# Patient Record
Sex: Female | Born: 1950 | Race: Black or African American | Hispanic: No | Marital: Married | Smoking: Never smoker
Health system: Southern US, Community
[De-identification: ages and names within clinical notes are randomized; demographics above are authoritative.]

## PROBLEM LIST (undated history)

## (undated) DIAGNOSIS — N6022 Fibroadenosis of left breast: Secondary | ICD-10-CM

## (undated) DIAGNOSIS — H409 Unspecified glaucoma: Secondary | ICD-10-CM

## (undated) DIAGNOSIS — M1990 Unspecified osteoarthritis, unspecified site: Secondary | ICD-10-CM

## (undated) DIAGNOSIS — M9250 Juvenile osteochondrosis of tibia and fibula, unspecified leg: Secondary | ICD-10-CM

## (undated) DIAGNOSIS — H5010 Unspecified exotropia: Secondary | ICD-10-CM

## (undated) DIAGNOSIS — Z1211 Encounter for screening for malignant neoplasm of colon: Secondary | ICD-10-CM

## (undated) DIAGNOSIS — Z86010 Personal history of colonic polyps: Secondary | ICD-10-CM

## (undated) DIAGNOSIS — K219 Gastro-esophageal reflux disease without esophagitis: Secondary | ICD-10-CM

## (undated) DIAGNOSIS — E119 Type 2 diabetes mellitus without complications: Secondary | ICD-10-CM

## (undated) DIAGNOSIS — I10 Essential (primary) hypertension: Secondary | ICD-10-CM

## (undated) DIAGNOSIS — M92529 Juvenile osteochondrosis of tibia tubercle, unspecified leg: Secondary | ICD-10-CM

## (undated) DIAGNOSIS — H50111 Monocular exotropia, right eye: Secondary | ICD-10-CM

## (undated) HISTORY — PX: ESOPHAGOGASTRODUODENOSCOPY: SHX1529

## (undated) HISTORY — PX: TUBAL LIGATION: SHX77

---

## 2003-11-14 ENCOUNTER — Ambulatory Visit: Payer: Self-pay | Admitting: Family Medicine

## 2005-03-07 ENCOUNTER — Ambulatory Visit: Payer: Self-pay | Admitting: Family Medicine

## 2007-03-26 ENCOUNTER — Ambulatory Visit: Payer: Self-pay | Admitting: Gastroenterology

## 2016-08-06 ENCOUNTER — Encounter: Payer: Self-pay | Admitting: *Deleted

## 2016-08-07 ENCOUNTER — Encounter
Admission: RE | Disposition: A | Discharge status: Home / Self Care | Payer: Self-pay | Source: Ambulatory Visit | Attending: Unknown Physician Specialty

## 2016-08-07 ENCOUNTER — Ambulatory Visit
Admission: RE | Admit: 2016-08-07 | Discharge: 2016-08-07 | Disposition: A | Discharge status: Home / Self Care | Payer: Medicare Other | Source: Ambulatory Visit | Attending: Unknown Physician Specialty | Admitting: Unknown Physician Specialty

## 2016-08-07 ENCOUNTER — Ambulatory Visit: Payer: Medicare Other | Admitting: Anesthesiology

## 2016-08-07 ENCOUNTER — Encounter: Payer: Self-pay | Admitting: *Deleted

## 2016-08-07 DIAGNOSIS — Z79899 Other long term (current) drug therapy: Secondary | ICD-10-CM | POA: Insufficient documentation

## 2016-08-07 DIAGNOSIS — Z7982 Long term (current) use of aspirin: Secondary | ICD-10-CM | POA: Insufficient documentation

## 2016-08-07 DIAGNOSIS — D123 Benign neoplasm of transverse colon: Secondary | ICD-10-CM | POA: Insufficient documentation

## 2016-08-07 DIAGNOSIS — K219 Gastro-esophageal reflux disease without esophagitis: Secondary | ICD-10-CM | POA: Diagnosis not present

## 2016-08-07 DIAGNOSIS — Z86010 Personal history of colonic polyps: Secondary | ICD-10-CM | POA: Diagnosis not present

## 2016-08-07 DIAGNOSIS — M1990 Unspecified osteoarthritis, unspecified site: Secondary | ICD-10-CM | POA: Diagnosis not present

## 2016-08-07 DIAGNOSIS — E119 Type 2 diabetes mellitus without complications: Secondary | ICD-10-CM | POA: Insufficient documentation

## 2016-08-07 DIAGNOSIS — H409 Unspecified glaucoma: Secondary | ICD-10-CM | POA: Diagnosis not present

## 2016-08-07 DIAGNOSIS — M9250 Juvenile osteochondrosis of tibia and fibula, unspecified leg: Secondary | ICD-10-CM | POA: Diagnosis not present

## 2016-08-07 DIAGNOSIS — I10 Essential (primary) hypertension: Secondary | ICD-10-CM | POA: Insufficient documentation

## 2016-08-07 DIAGNOSIS — Z1211 Encounter for screening for malignant neoplasm of colon: Secondary | ICD-10-CM | POA: Diagnosis not present

## 2016-08-07 DIAGNOSIS — Z794 Long term (current) use of insulin: Secondary | ICD-10-CM | POA: Diagnosis not present

## 2016-08-07 DIAGNOSIS — K640 First degree hemorrhoids: Secondary | ICD-10-CM | POA: Diagnosis not present

## 2016-08-07 HISTORY — DX: Unspecified exotropia: H50.10

## 2016-08-07 HISTORY — DX: Monocular exotropia, right eye: H50.111

## 2016-08-07 HISTORY — DX: Fibroadenosis of left breast: N60.22

## 2016-08-07 HISTORY — DX: Unspecified osteoarthritis, unspecified site: M19.90

## 2016-08-07 HISTORY — DX: Gastro-esophageal reflux disease without esophagitis: K21.9

## 2016-08-07 HISTORY — DX: Juvenile osteochondrosis of tibia tubercle, unspecified leg: M92.529

## 2016-08-07 HISTORY — DX: Personal history of colonic polyps: Z86.010

## 2016-08-07 HISTORY — DX: Essential (primary) hypertension: I10

## 2016-08-07 HISTORY — DX: Unspecified glaucoma: H40.9

## 2016-08-07 HISTORY — PX: COLONOSCOPY WITH PROPOFOL: SHX5780

## 2016-08-07 HISTORY — DX: Type 2 diabetes mellitus without complications: E11.9

## 2016-08-07 HISTORY — DX: Juvenile osteochondrosis of tibia and fibula, unspecified leg: M92.50

## 2016-08-07 HISTORY — DX: Encounter for screening for malignant neoplasm of colon: Z12.11

## 2016-08-07 LAB — GLUCOSE, CAPILLARY: Glucose-Capillary: 138 mg/dL — ABNORMAL HIGH (ref 65–99)

## 2016-08-07 SURGERY — COLONOSCOPY WITH PROPOFOL
Anesthesia: General

## 2016-08-07 MED ORDER — EPINEPHRINE PF 1 MG/10ML IJ SOSY
PREFILLED_SYRINGE | INTRAMUSCULAR | Status: AC
  Filled 2016-08-07: qty 10

## 2016-08-07 MED ORDER — PROPOFOL 500 MG/50ML IV EMUL
INTRAVENOUS | Status: DC | PRN
  Administered 2016-08-07: 120 ug/kg/min via INTRAVENOUS

## 2016-08-07 MED ORDER — SODIUM CHLORIDE 0.9 % IV SOLN
INTRAVENOUS | Status: DC | PRN
  Administered 2016-08-07: 07:00:00 via INTRAVENOUS

## 2016-08-07 MED ORDER — FENTANYL CITRATE (PF) 100 MCG/2ML IJ SOLN
INTRAMUSCULAR | Status: AC
  Filled 2016-08-07: qty 2

## 2016-08-07 MED ORDER — PROPOFOL 500 MG/50ML IV EMUL
INTRAVENOUS | Status: AC
  Filled 2016-08-07: qty 50

## 2016-08-07 MED ORDER — SODIUM CHLORIDE 0.9 % IJ SOLN
INTRAMUSCULAR | Status: AC
  Filled 2016-08-07: qty 10

## 2016-08-07 MED ORDER — LIDOCAINE HCL (PF) 2 % IJ SOLN
INTRAMUSCULAR | Status: AC
  Filled 2016-08-07: qty 2

## 2016-08-07 MED ORDER — FENTANYL CITRATE (PF) 100 MCG/2ML IJ SOLN
INTRAMUSCULAR | Status: DC | PRN
  Administered 2016-08-07: 50 ug via INTRAVENOUS

## 2016-08-07 MED ORDER — SODIUM CHLORIDE 0.9 % IV SOLN: INTRAVENOUS | Status: DC

## 2016-08-07 MED ORDER — PROPOFOL 10 MG/ML IV BOLUS
INTRAVENOUS | Status: AC
  Filled 2016-08-07: qty 20

## 2016-08-07 MED ORDER — PROPOFOL 10 MG/ML IV BOLUS
INTRAVENOUS | Status: DC | PRN
  Administered 2016-08-07: 80 mg via INTRAVENOUS

## 2016-08-07 MED ORDER — LIDOCAINE 2% (20 MG/ML) 5 ML SYRINGE
INTRAMUSCULAR | Status: DC | PRN
  Administered 2016-08-07: 30 mg via INTRAVENOUS

## 2016-08-07 MED ORDER — PHENYLEPHRINE HCL 10 MG/ML IJ SOLN
INTRAMUSCULAR | Status: AC
  Filled 2016-08-07: qty 1

## 2016-08-07 MED ORDER — MIDAZOLAM HCL 2 MG/2ML IJ SOLN
INTRAMUSCULAR | Status: AC
  Filled 2016-08-07: qty 2

## 2016-08-07 MED ORDER — MIDAZOLAM HCL 5 MG/5ML IJ SOLN
INTRAMUSCULAR | Status: DC | PRN
  Administered 2016-08-07: 1 mg via INTRAVENOUS

## 2016-08-07 NOTE — Transfer of Care (Signed)
Immediate Anesthesia Transfer of Care Note  Patient: Haley [name]  Procedure(s) Performed: Procedure(s): COLONOSCOPY WITH PROPOFOL (N/A)  Patient Location: PACU and Endoscopy Unit  Anesthesia Type:General  Level of Consciousness: drowsy  Airway & Oxygen Therapy: Patient Spontanous Breathing and Patient connected to nasal cannula oxygen  Post-op Assessment: Report given to RN and Post -op Vital signs reviewed and stable  Post vital signs: Reviewed and stable  Last Vitals:  Vitals:   08/07/16 0707  BP: (!) 163/84  Pulse: 88  Resp: 18  Temp: (!) 35.9 C    Last Pain:  Vitals:   08/07/16 0707  TempSrc: Tympanic         Complications: No apparent anesthesia complications

## 2016-08-07 NOTE — Anesthesia Post-op Follow-up Note (Cosign Needed)
Anesthesia QCDR form completed.        

## 2016-08-07 NOTE — Op Note (Signed)
[facility]astroenterology Patient Name: Haley [name] Procedure Date: 08/07/2016 7:17 AM MRN: [id number] Account #: [id number] Date of Birth: 05-12-1950 Admit Type: Outpatient Age: 66 Room: [facility] ENDO ROOM 3 Gender: Female Note Status: Finalized Procedure:            Colonoscopy Indications:          High risk colon cancer surveillance: Personal history                        of colonic polyps Providers:            Manya Silvas, MD Medicines:            Propofol per Anesthesia Complications:        No immediate complications. Procedure:            Pre-Anesthesia Assessment:                       - After reviewing the risks and benefits, the patient                        was deemed in satisfactory condition to undergo the                        procedure.                       After obtaining informed consent, the colonoscope was                        passed under direct vision. Throughout the procedure,                        the patient's blood pressure, pulse, and oxygen                        saturations were monitored continuously. The                        Colonoscope was introduced through the anus and                        advanced to the the cecum, identified by appendiceal                        orifice and ileocecal valve. The colonoscopy was                        somewhat difficult due to restricted mobility of the                        colon. Successful completion of the procedure was aided                        by changing endoscopes. Findings:      A diminutive polyp was found in the hepatic flexure. The polyp was       sessile. The polyp was removed with a jumbo cold forceps. Resection and       retrieval were complete.      Internal hemorrhoids were found during endoscopy. The hemorrhoids were       small and Grade  I (internal hemorrhoids that do not prolapse).      The exam was otherwise without abnormality. Impression:           -  One diminutive polyp at the hepatic flexure, removed                        with a jumbo cold forceps. Resected and retrieved.                       - Internal hemorrhoids.                       - The examination was otherwise normal. Recommendation:       - Await pathology results.                       - Repeat colonoscopy in 5 years for surveillance. Manya Silvas, MD 08/07/2016 8:24:40 AM This report has been signed electronically. Number of Addenda: 0 Note Initiated On: 08/07/2016 7:17 AM Scope Withdrawal Time: 0 hours 9 minutes 27 seconds  Total Procedure Duration: 0 hours 23 minutes 18 seconds       [facility]

## 2016-08-07 NOTE — Anesthesia Postprocedure Evaluation (Signed)
Anesthesia Post Note  Patient: Haley [name]  Procedure(s) Performed: Procedure(s) (LRB): COLONOSCOPY WITH PROPOFOL (N/A)  Patient location during evaluation: Endoscopy Anesthesia Type: General Level of consciousness: awake and alert Pain management: pain level controlled Vital Signs Assessment: post-procedure vital signs reviewed and stable Respiratory status: spontaneous breathing, nonlabored ventilation, respiratory function stable and patient connected to nasal cannula oxygen Cardiovascular status: blood pressure returned to baseline and stable Postop Assessment: no signs of nausea or vomiting Anesthetic complications: no     Last Vitals:  Vitals:   08/07/16 0845 08/07/16 0855  BP: (!) 153/88 (!) 167/103  Pulse: 85 83  Resp: 13 18  Temp:      Last Pain:  Vitals:   08/07/16 0825  TempSrc: Tympanic                 Martha Clan

## 2016-08-07 NOTE — Anesthesia Preprocedure Evaluation (Signed)
Anesthesia Evaluation  Patient identified by MRN, date of birth, ID band Patient awake    Reviewed: Allergy & Precautions, H&P , NPO status , Patient's Chart, lab work & pertinent test results, reviewed documented beta blocker date and time   History of Anesthesia Complications Negative for: history of anesthetic complications  Airway Mallampati: I  TM Distance: >3 FB Neck ROM: full    Dental  (+) Dental Advidsory Given   Pulmonary neg pulmonary ROS,           Cardiovascular Exercise Tolerance: Good hypertension, (-) angina(-) CAD, (-) Past MI, (-) Cardiac Stents and (-) CABG (-) dysrhythmias (-) Valvular Problems/Murmurs     Neuro/Psych negative neurological ROS  negative psych ROS   GI/Hepatic Neg liver ROS, GERD  ,  Endo/Other  diabetes  Renal/GU negative Renal ROS  negative genitourinary   Musculoskeletal   Abdominal   Peds  Hematology negative hematology ROS (+)   Anesthesia Other Findings Past Medical History: No date: Arthritis No date: Diabetes mellitus without complication (HCC) No date: Encounter for colonoscopy due to history of adenomatous  colonic polyps No date: 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, left No date: GERD (gastroesophageal reflux disease) No date: Glaucoma No date: Hypertension No date: Osgood-Schlatter's disease   Reproductive/Obstetrics negative OB ROS                             Anesthesia Physical Anesthesia Plan  ASA: III  Anesthesia Plan: General   Post-op Pain Management:    Induction: Intravenous  PONV Risk Score and Plan: 3 and Propofol  Airway Management Planned: Natural Airway and Nasal Cannula  Additional Equipment:   Intra-op Plan:   Post-operative Plan: Extubation in OR  Informed Consent: I have reviewed the patients History and Physical, chart, labs and discussed the procedure including the risks, benefits  and alternatives for the proposed anesthesia with the patient or authorized representative who has indicated his/her understanding and acceptance.   Dental Advisory Given  Plan Discussed with: Anesthesiologist, CRNA and Surgeon  Anesthesia Plan Comments:         Anesthesia Quick Evaluation

## 2016-08-07 NOTE — H&P (Signed)
Primary Care Physician:  System, Provider Not In Primary Gastroenterologist:  Dr. Vira Agar  Pre-Procedure History & Physical: HPI:  Haley [name] is a 66 y.o. female is here for an colonoscopy.   Past Medical History:  Diagnosis Date  . Arthritis   . Diabetes mellitus without complication ([place])   . Encounter for colonoscopy due to history of adenomatous colonic polyps   . Exotropia of right eye   . Fibroadenosis of breast, left   . GERD (gastroesophageal reflux disease)   . Glaucoma   . Hypertension   . Osgood-Schlatter's disease     Past Surgical History:  Procedure Laterality Date  . ESOPHAGOGASTRODUODENOSCOPY    . TUBAL LIGATION      Prior to Admission medications   Medication Sig Start Date End Date Taking? Authorizing Provider  aspirin EC 81 MG tablet Take 81 mg by mouth daily. 1/2 tab by mouth daily   Yes [provider]  brimonidine (ALPHAGAN) 0.15 % ophthalmic solution Place 1 drop into both eyes every 12 (twelve) hours.   Yes [provider]  cyanocobalamin 1000 MCG tablet Take 1,000 mcg by mouth daily.   Yes [provider]  glipiZIDE (GLUCOTROL XL) 10 MG 24 hr tablet Take 10 mg by mouth 2 (two) times daily.   Yes [provider]  Insulin Glargine (BASAGLAR KWIKPEN) 100 UNIT/ML SOPN Inject 30 Units into the skin at bedtime.   Yes [provider]  latanoprost (XALATAN) 0.005 % ophthalmic solution Place 1 drop into both eyes at bedtime.   Yes [provider]  losartan-hydrochlorothiazide (HYZAAR) 100-25 MG tablet Take 1 tablet by mouth daily.   Yes [provider]  Multiple Vitamin (MULTIVITAMIN) tablet Take 1 tablet by mouth daily.   Yes [provider]  omeprazole (PRILOSEC) 20 MG capsule Take 20 mg by mouth daily.   Yes [provider]  pravastatin (PRAVACHOL) 80 MG tablet Take 80 mg by mouth daily.   Yes [provider]  sitaGLIPtin-metformin (JANUMET) 50-1000 MG tablet Take 1  tablet by mouth 2 (two) times daily with a meal.   Yes [provider]    Allergies as of 06/18/2016  . (Not on File)    History reviewed. No pertinent family history.  Social History   Social History  . Marital status: Married    Spouse name: N/A  . Number of children: N/A  . Years of education: N/A   Occupational History  . Not on file.   Social History Main Topics  . Smoking status: Never Smoker  . Smokeless tobacco: Never Used  . Alcohol use No  . Drug use: No  . Sexual activity: Not on file   Other Topics Concern  . Not on file   Social History Narrative  . No narrative on file    Review of Systems: See HPI, otherwise negative ROS  Physical Exam: BP (!) 163/84   Pulse 88   Temp (!) 96.6 F (35.9 C) (Tympanic)   Resp 18   Ht 5\' 6"  (1.676 m)   Wt 69.4 kg (153 lb)   SpO2 99%   BMI 24.69 kg/m  General:   Alert,  pleasant and cooperative in NAD Head:  Normocephalic and atraumatic. Neck:  Supple; no masses or thyromegaly. Lungs:  Clear throughout to auscultation.    Heart:  Regular rate and rhythm. Abdomen:  Soft, nontender and nondistended. Normal bowel sounds, without guarding, and without rebound.   Neurologic:  Alert and  oriented x4;  grossly normal neurologically.  Impression/Plan: [name] is here for an colonoscopy to be performed for colonoscopy due to [facility] colon polyps.  Also family history of colon cancer in daughter.  Risks, benefits, limitations, and alternatives regarding  colonoscopy have been reviewed with the patient.  Questions have been answered.  All parties agreeable.   Gaylyn Cheers, MD  08/07/2016, 7:53 AM

## 2016-08-08 ENCOUNTER — Encounter: Payer: Self-pay | Admitting: Unknown Physician Specialty

## 2016-08-08 LAB — SURGICAL PATHOLOGY

## 2017-11-02 ENCOUNTER — Encounter: Payer: Self-pay | Admitting: Emergency Medicine

## 2017-11-02 ENCOUNTER — Emergency Department: Payer: No Typology Code available for payment source

## 2017-11-02 ENCOUNTER — Other Ambulatory Visit: Payer: Self-pay

## 2017-11-02 ENCOUNTER — Emergency Department
Admission: EM | Admit: 2017-11-02 | Discharge: 2017-11-02 | Disposition: A | Discharge status: Home / Self Care | Payer: No Typology Code available for payment source | Attending: Emergency Medicine | Admitting: Emergency Medicine

## 2017-11-02 DIAGNOSIS — I10 Essential (primary) hypertension: Secondary | ICD-10-CM | POA: Diagnosis not present

## 2017-11-02 DIAGNOSIS — M25511 Pain in right shoulder: Secondary | ICD-10-CM | POA: Insufficient documentation

## 2017-11-02 DIAGNOSIS — E119 Type 2 diabetes mellitus without complications: Secondary | ICD-10-CM | POA: Insufficient documentation

## 2017-11-02 DIAGNOSIS — M25512 Pain in left shoulder: Secondary | ICD-10-CM | POA: Insufficient documentation

## 2017-11-02 DIAGNOSIS — Z79899 Other long term (current) drug therapy: Secondary | ICD-10-CM | POA: Insufficient documentation

## 2017-11-02 DIAGNOSIS — R51 Headache: Secondary | ICD-10-CM | POA: Insufficient documentation

## 2017-11-02 DIAGNOSIS — Z7982 Long term (current) use of aspirin: Secondary | ICD-10-CM | POA: Insufficient documentation

## 2017-11-02 DIAGNOSIS — M542 Cervicalgia: Secondary | ICD-10-CM | POA: Diagnosis not present

## 2017-11-02 DIAGNOSIS — Z794 Long term (current) use of insulin: Secondary | ICD-10-CM | POA: Insufficient documentation

## 2017-11-02 MED ORDER — HYDROCODONE-ACETAMINOPHEN 5-325 MG PO TABS: 1.0000 | ORAL_TABLET | Freq: Four times a day (QID) | ORAL | 0 refills | Status: AC | PRN

## 2017-11-02 MED ORDER — OXYCODONE-ACETAMINOPHEN 5-325 MG PO TABS
1.0000 | ORAL_TABLET | Freq: Once | ORAL | Status: AC
  Administered 2017-11-02: 1 via ORAL
  Filled 2017-11-02: qty 1

## 2017-11-02 MED ORDER — OXYCODONE HCL 5 MG PO TABS
ORAL_TABLET | ORAL | Status: AC
  Filled 2017-11-02: qty 1
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
## 2017-11-02 NOTE — ED Notes (Signed)
Pt arrives with c-collar

## 2018-07-30 ENCOUNTER — Other Ambulatory Visit: Payer: Self-pay

## 2018-07-30 DIAGNOSIS — Z20822 Contact with and (suspected) exposure to covid-19: Secondary | ICD-10-CM

## 2018-08-04 LAB — NOVEL CORONAVIRUS, NAA: SARS-CoV-2, NAA: NOT DETECTED
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
## 2022-03-11 ENCOUNTER — Encounter: Payer: Self-pay | Admitting: Genetic Counselor

## 2022-03-12 ENCOUNTER — Inpatient Hospital Stay: Payer: Medicare PPO

## 2022-03-12 ENCOUNTER — Inpatient Hospital Stay: Payer: Medicare PPO | Attending: Oncology | Admitting: Licensed Clinical Social Worker

## 2022-03-28 ENCOUNTER — Other Ambulatory Visit: Payer: Self-pay | Admitting: Nephrology

## 2022-03-28 DIAGNOSIS — R8290 Unspecified abnormal findings in urine: Secondary | ICD-10-CM

## 2022-03-28 DIAGNOSIS — E1122 Type 2 diabetes mellitus with diabetic chronic kidney disease: Secondary | ICD-10-CM

## 2022-04-09 ENCOUNTER — Ambulatory Visit
Admission: RE | Admit: 2022-04-09 | Discharge: 2022-04-09 | Disposition: A | Discharge status: Home / Self Care | Payer: Medicare PPO | Source: Ambulatory Visit | Attending: Nephrology | Admitting: Nephrology

## 2022-04-09 DIAGNOSIS — E1122 Type 2 diabetes mellitus with diabetic chronic kidney disease: Secondary | ICD-10-CM | POA: Insufficient documentation

## 2022-04-09 DIAGNOSIS — N1832 Chronic kidney disease, stage 3b: Secondary | ICD-10-CM | POA: Diagnosis present

## 2022-04-09 DIAGNOSIS — R8290 Unspecified abnormal findings in urine: Secondary | ICD-10-CM | POA: Insufficient documentation

## 2022-04-16 ENCOUNTER — Encounter: Payer: Self-pay | Admitting: Licensed Clinical Social Worker

## 2022-04-16 ENCOUNTER — Inpatient Hospital Stay: Payer: Medicare PPO

## 2022-04-16 ENCOUNTER — Inpatient Hospital Stay: Payer: Medicare PPO | Attending: Oncology | Admitting: Licensed Clinical Social Worker

## 2022-04-16 DIAGNOSIS — Z803 Family history of malignant neoplasm of breast: Secondary | ICD-10-CM

## 2022-04-16 DIAGNOSIS — Z1501 Genetic susceptibility to malignant neoplasm of breast: Secondary | ICD-10-CM | POA: Diagnosis not present

## 2022-04-16 DIAGNOSIS — Z800 Family history of malignant neoplasm of digestive organs: Secondary | ICD-10-CM

## 2022-04-16 DIAGNOSIS — Z8041 Family history of malignant neoplasm of ovary: Secondary | ICD-10-CM

## 2022-04-16 NOTE — Progress Notes (Signed)
REFERRING PROVIDER: Marval Regal, NP [place],  [place] [id number]  PRIMARY PROVIDER:  System, Provider Not In  PRIMARY REASON FOR VISIT:  1. Family history of breast cancer   2. Family history of ovarian cancer   3. Family history of rectal cancer      HISTORY OF PRESENT ILLNESS:   Haley [name], a 72 y.o. female, was seen for a [facility] cancer genetics consultation at the request of Dr. Jerelene Redden due to a family history of cancer.  Haley [name] presents to clinic today to discuss the possibility of a hereditary predisposition to cancer, genetic testing, and to further clarify her future cancer risks, as well as potential cancer risks for family members.   CANCER HISTORY:   Haley [name] is a 72 y.o. female with no personal history of cancer.    RISK FACTORS:  Ovaries intact: yes.  Hysterectomy: no.  Menopausal status: postmenopausal.  Colonoscopy: yes;  cumulative 4 polyps . Mammogram within the last year: yes. Number of breast biopsies: 1.  Past Medical History:  Diagnosis Date   Arthritis    Diabetes mellitus without complication    Encounter for colonoscopy due to history of adenomatous colonic polyps    Exotropia of right eye    Fibroadenosis of breast, left    GERD (gastroesophageal reflux disease)    Glaucoma    Hypertension    Osgood-Schlatter's disease     Past Surgical History:  Procedure Laterality Date   COLONOSCOPY WITH PROPOFOL N/A 08/07/2016   Procedure: COLONOSCOPY WITH PROPOFOL;  Surgeon: Manya Silvas, MD;  Location: [facility] ENDOSCOPY;  Service: Endoscopy;  Laterality: N/A;   ESOPHAGOGASTRODUODENOSCOPY     TUBAL LIGATION      FAMILY HISTORY:  We obtained a detailed, 4-generation family history.  Significant diagnoses are listed below: Family History  Problem Relation Age of Onset   Liver cancer Mother 27       primary cancer unknown   Ovarian cancer Sister 16   Breast cancer Sister 82   Cancer Sister 36       unsure type, on back  of head   Rectal cancer Daughter 22   Haley [name] had 1 son who passed at age 34, and 1 daughter who had rectal cancer at 57 and passed at 13. Unsure if she had genetic testing at that time. Haley [name] had 4 sisters. One had ovarian cancer at 2 and is living at 48, another had breast at 55 and is living at 50, and another sister had cancer on the back of her head, unknown type, at 77 and is living at 62.  Haley [name] mother had liver cancer, unsure if this is where the cancer started, and she passed at 79. No other known cancers on this side of the family or on the paternal side of the family.  Haley [name] is unaware of previous family history of genetic testing for hereditary cancer risks. There is no reported Ashkenazi Jewish ancestry. There is no known consanguinity.    GENETIC COUNSELING ASSESSMENT: Haley [name] is a 72 y.o. female with a family history of breast and ovarian cancer, and a family history of early onset rectal cancer, which is somewhat suggestive of a hereditary cancer syndrome and predisposition to cancer. We, therefore, discussed and recommended the following at today's visit.   DISCUSSION: We discussed that approximately 10% of  cancer is hereditary. Most cases of hereditary breast/ovarian cancer are associated with BRCA1/BRCA2 genes, although there are other  genes associated with hereditary cancer as well, including genes associated with rectal cancer. Cancers and risks are gene specific. We discussed that testing is beneficial for several reasons including knowing about cancer risks, identifying potential screening and risk-reduction options that may be appropriate, and to understand if other family members could be at risk for cancer and allow them to undergo genetic testing.   We reviewed the characteristics, features and inheritance patterns of hereditary cancer syndromes. We also discussed genetic testing, including the appropriate family members to test, the process of  testing, insurance coverage and turn-around-time for results. We discussed the implications of a negative, positive and/or variant of uncertain significant result. We recommended Haley [name] pursue genetic testing for the [facility] Common Hereditary Cancers+RNA gene panel.   Based on Haley [name]'s family history of cancer, she meets medical criteria for genetic testing. Despite that she meets criteria, she may still have an out of pocket cost. We discussed that if her out of pocket cost for testing is over $100, the laboratory will call and confirm whether she wants to proceed with testing.  If the out of pocket cost of testing is less than $100 she will be billed by the genetic testing laboratory.   PLAN: After considering the risks, benefits, and limitations, Haley [name] provided informed consent to pursue genetic testing and the blood sample was sent to [facility] for analysis of the Common Hereditary Cancers+RNA panel. Results should be available within approximately 2-3 weeks' time, at which point they will be disclosed by telephone to Haley [name], as will any additional recommendations warranted by these results. Haley [name] will receive a summary of her genetic counseling visit and a copy of her results once available. This information will also be available in Epic.   Haley [name] were answered to her satisfaction today. Our contact information was provided should additional [name] or concerns arise. Thank you for the referral and allowing [place] to share in the care of your patient.   Faith Rogue, MS, [facility] Genetic Counselor [name] Phone: [phone]  The patient was seen for a total of 30 minutes in face-to-face genetic counseling.  Dr. Grayland Ormond was available for discussion regarding this case.   _______________________________________________________________________ For Office Staff:  Number of people involved in session: 1 Was an Intern/ student  involved with case: no

## 2022-05-01 ENCOUNTER — Encounter: Payer: Self-pay | Admitting: Licensed Clinical Social Worker

## 2022-05-01 ENCOUNTER — Telehealth: Payer: Self-pay | Admitting: Licensed Clinical Social Worker

## 2022-05-01 ENCOUNTER — Ambulatory Visit: Payer: Self-pay | Admitting: Licensed Clinical Social Worker

## 2022-05-01 DIAGNOSIS — Z1379 Encounter for other screening for genetic and chromosomal anomalies: Secondary | ICD-10-CM | POA: Insufficient documentation

## 2022-05-01 NOTE — Telephone Encounter (Signed)
I contacted [name] to discuss her genetic testing results. No pathogenic variants were identified in the 48 genes analyzed. Detailed clinic note to follow.   The test report has been scanned into EPIC and is located under the Molecular Pathology section of the Results Review tab.  A portion of the result report is included below for reference.     Lacy Duverney, MS, [facility] Genetic Counselor [email or web address] Phone: [phone]

## 2022-05-01 NOTE — Progress Notes (Signed)
HPI:   Ms. Cassels was previously seen in the [facility] due to a family history of cancer and concerns regarding a hereditary predisposition to cancer. Please refer to our prior cancer genetics clinic note for more information regarding our discussion, assessment and recommendations, at the time. Ms. Valdivia recent genetic test results were disclosed to her, as were recommendations warranted by these results. These results and recommendations are discussed in more detail below.  CANCER HISTORY:  Oncology History   No history exists.    FAMILY HISTORY:  We obtained a detailed, 4-generation family history.  Significant diagnoses are listed below: Family History  Problem Relation Age of Onset   Liver cancer Mother 75       primary cancer unknown   Ovarian cancer Sister 3   Breast cancer Sister 16   Cancer Sister 90       unsure type, on back of head   Rectal cancer Daughter 88   Ms. Brash had 1 son who passed at age 41, and 1 daughter who had rectal cancer at 44 and passed at 67. Unsure if she had genetic testing at that time. Ms. Comp had 4 sisters. One had ovarian cancer at 53 and is living at 87, another had breast at 101 and is living at 64, and another sister had cancer on the back of her head, unknown type, at 54 and is living at 37.   Ms. Melendrez mother had liver cancer, unsure if this is where the cancer started, and she passed at 27. No other known cancers on this side of the family or on the paternal side of the family.   Ms. Win is unaware of previous family history of genetic testing for hereditary cancer risks. There is no reported Ashkenazi Jewish ancestry. There is no known consanguinity.     GENETIC TEST RESULTS:  The [facility] Common Hereditary Cancers+RNA Panel found no pathogenic mutations.   The Common Hereditary Cancers Panel + RNA offered by [facility] includes sequencing and/or deletion duplication testing of the following 48 genes: APC*,  ATM*, AXIN2, BAP1, BARD1, BMPR1A, BRCA1, BRCA2, BRIP1, CDH1, CDK4, CDKN2A (p14ARF), CDKN2A (p16INK4a), CHEK2, CTNNA1, DICER1*, EPCAM*, FH*, GREM1*, HOXB13, KIT, MBD4, MEN1*, MLH1*, MSH2*, MSH3*, MSH6*, MUTYH, NF1*, NTHL1, PALB2, PDGFRA, PMS2*, POLD1*, POLE, PTEN*, RAD51C, RAD51D, SDHA*, SDHB, SDHC*, SDHD, SMAD4, SMARCA4, STK11, TP53, TSC1*, TSC2, VHL.Marland Kitchen   The test report has been scanned into EPIC and is located under the Molecular Pathology section of the Results Review tab.  A portion of the result report is included below for reference. Genetic testing reported out on 04/26/2022.      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  At this time, it is unknown if this variant is associated with an increased risk for cancer or if it is benign, but most uncertain variants are reclassified to benign. It should not be used to make medical management decisions. With time, we suspect the laboratory will determine the significance of this variant, if any. If the laboratory reclassifies this variant, we will attempt to contact Ms. Colaizzi to discuss it further.   Even though a pathogenic variant was not identified, possible explanations for the cancer in the family may include: There may be no hereditary risk for cancer in the family. The cancers in Ms. Lambing and/or her family may be sporadic/familial or due to other genetic and environmental factors. There may be a gene mutation in one of these genes  that current testing methods cannot detect but that chance is small. There could be another gene that has not yet been discovered, or that we have not yet tested, that is responsible for the cancer diagnoses in the family.  It is also possible there is a hereditary cause for the cancer in the family that Ms. Slimp did not inherit.  Therefore, it is important to remain in touch with cancer genetics in the future so that we can continue to offer Ms. Huckeba the most up to  date genetic testing.   ADDITIONAL GENETIC TESTING:  We discussed with Ms. Bucy that her genetic testing was fairly extensive.  If there are additional relevant genes identified to increase cancer risk that can be analyzed in the future, we would be happy to discuss and coordinate this testing at that time.    CANCER SCREENING RECOMMENDATIONS:  Ms. Camarena test result is considered negative (normal).  This means that we have not identified a hereditary cause for her family history of cancer at this time.   An individual's cancer risk and medical management are not determined by genetic test results alone. Overall cancer risk assessment incorporates additional factors, including personal medical history, family history, and any available genetic information that may result in a personalized plan for cancer prevention and surveillance. Therefore, it is recommended she continue to follow the cancer management and screening guidelines provided by her  primary healthcare provider.  RECOMMENDATIONS FOR FAMILY MEMBERS:   Since she did not inherit a identifiable mutation in a cancer predisposition gene included on this panel, her children could not have inherited a known mutation from her in one of these genes. Individuals in this family might be at some increased risk of developing cancer, over the general population risk, due to the family history of cancer.  Individuals in the family should notify their providers of the family history of cancer. We recommend women in this family have a yearly mammogram beginning at age 67, or 30 years younger than the earliest onset of cancer, an annual clinical breast exam, and perform monthly breast self-exams.  Family members should have colonoscopies by at age 33, or earlier, as recommended by their providers. Other members of the family may still carry a pathogenic variant in one of these genes that Ms. Sorci did not inherit. Based on the family history, we  recommend her sister who had ovarian cancer have genetic counseling and testing. Ms. Louderback will let us know if we can be of any assistance in coordinating genetic counseling and/or testing for this family member.   We do not recommend familial testing for the POLE variant of uncertain significance (VUS).  FOLLOW-UP:  Lastly, we discussed with Ms. Sak that cancer genetics is a rapidly advancing field and it is possible that new genetic tests will be appropriate for her and/or her family members in the future. We encouraged her to remain in contact with cancer genetics on an annual basis so we can update her personal and family histories and let her know of advances in cancer genetics that may benefit this family.   Our contact number was provided. Ms. Capek questions were answered to her satisfaction, and she knows she is welcome to call us at anytime with additional questions or concerns.    Lacy Duverney, MS, [facility] Genetic Counselor [email or web address] Phone: [phone]

## 2022-05-02 ENCOUNTER — Encounter: Payer: Self-pay | Admitting: *Deleted

## 2022-05-03 ENCOUNTER — Ambulatory Visit: Payer: Medicare PPO | Admitting: Anesthesiology

## 2022-05-03 ENCOUNTER — Encounter: Payer: Self-pay | Admitting: *Deleted

## 2022-05-03 ENCOUNTER — Encounter
Admission: RE | Disposition: A | Discharge status: Home / Self Care | Payer: Self-pay | Source: Home / Self Care | Attending: Gastroenterology

## 2022-05-03 ENCOUNTER — Other Ambulatory Visit: Payer: Self-pay

## 2022-05-03 ENCOUNTER — Ambulatory Visit
Admission: RE | Admit: 2022-05-03 | Discharge: 2022-05-03 | Disposition: A | Discharge status: Home / Self Care | Payer: Medicare PPO | Attending: Gastroenterology | Admitting: Gastroenterology

## 2022-05-03 DIAGNOSIS — Z1211 Encounter for screening for malignant neoplasm of colon: Secondary | ICD-10-CM | POA: Insufficient documentation

## 2022-05-03 DIAGNOSIS — Z7985 Long-term (current) use of injectable non-insulin antidiabetic drugs: Secondary | ICD-10-CM | POA: Insufficient documentation

## 2022-05-03 DIAGNOSIS — D122 Benign neoplasm of ascending colon: Secondary | ICD-10-CM | POA: Insufficient documentation

## 2022-05-03 DIAGNOSIS — K640 First degree hemorrhoids: Secondary | ICD-10-CM | POA: Diagnosis not present

## 2022-05-03 DIAGNOSIS — N184 Chronic kidney disease, stage 4 (severe): Secondary | ICD-10-CM | POA: Insufficient documentation

## 2022-05-03 DIAGNOSIS — D123 Benign neoplasm of transverse colon: Secondary | ICD-10-CM | POA: Diagnosis not present

## 2022-05-03 DIAGNOSIS — Z794 Long term (current) use of insulin: Secondary | ICD-10-CM | POA: Insufficient documentation

## 2022-05-03 DIAGNOSIS — K648 Other hemorrhoids: Secondary | ICD-10-CM | POA: Insufficient documentation

## 2022-05-03 DIAGNOSIS — K219 Gastro-esophageal reflux disease without esophagitis: Secondary | ICD-10-CM | POA: Diagnosis not present

## 2022-05-03 DIAGNOSIS — Z7984 Long term (current) use of oral hypoglycemic drugs: Secondary | ICD-10-CM | POA: Diagnosis not present

## 2022-05-03 DIAGNOSIS — I129 Hypertensive chronic kidney disease with stage 1 through stage 4 chronic kidney disease, or unspecified chronic kidney disease: Secondary | ICD-10-CM | POA: Diagnosis not present

## 2022-05-03 DIAGNOSIS — E1122 Type 2 diabetes mellitus with diabetic chronic kidney disease: Secondary | ICD-10-CM | POA: Insufficient documentation

## 2022-05-03 DIAGNOSIS — Z800 Family history of malignant neoplasm of digestive organs: Secondary | ICD-10-CM | POA: Insufficient documentation

## 2022-05-03 DIAGNOSIS — Z86010 Personal history of colonic polyps: Secondary | ICD-10-CM | POA: Insufficient documentation

## 2022-05-03 DIAGNOSIS — Z09 Encounter for follow-up examination after completed treatment for conditions other than malignant neoplasm: Secondary | ICD-10-CM | POA: Insufficient documentation

## 2022-05-03 HISTORY — PX: COLONOSCOPY WITH PROPOFOL: SHX5780

## 2022-05-03 SURGERY — COLONOSCOPY WITH PROPOFOL
Anesthesia: General

## 2022-05-03 MED ORDER — SODIUM CHLORIDE 0.9 % IV SOLN: INTRAVENOUS | Status: DC

## 2022-05-03 MED ORDER — LIDOCAINE HCL (PF) 2 % IJ SOLN
INTRAMUSCULAR | Status: DC | PRN
  Administered 2022-05-03: 30 mg via INTRADERMAL

## 2022-05-03 MED ORDER — PROPOFOL 1000 MG/100ML IV EMUL
INTRAVENOUS | Status: AC
  Filled 2022-05-03: qty 100

## 2022-05-03 MED ORDER — STERILE WATER FOR IRRIGATION IR SOLN
Status: DC | PRN
  Administered 2022-05-03: 120 mL

## 2022-05-03 MED ORDER — PROPOFOL 10 MG/ML IV BOLUS
INTRAVENOUS | Status: DC | PRN
  Administered 2022-05-03 (×7): 20 mg via INTRAVENOUS
  Administered 2022-05-03: 30 mg via INTRAVENOUS
  Administered 2022-05-03: 20 mg via INTRAVENOUS
  Administered 2022-05-03: 50 mg via INTRAVENOUS
  Administered 2022-05-03 (×5): 20 mg via INTRAVENOUS

## 2022-05-03 NOTE — Transfer of Care (Signed)
Immediate Anesthesia Transfer of Care Note  Patient: Haley [name]  Procedure(s) Performed: COLONOSCOPY WITH PROPOFOL  Patient Location: PACU and Endoscopy Unit  Anesthesia Type:MAC  Level of Consciousness: drowsy  Airway & Oxygen Therapy: Patient Spontanous Breathing and Patient connected to nasal cannula oxygen  Post-op Assessment: Report given to RN and Post -op Vital signs reviewed and stable  Post vital signs: Reviewed and stable  Last Vitals:  Vitals Value Taken Time  BP 110/66 05/03/22 0903  Temp 35.6 C 05/03/22 0903  Pulse 82 05/03/22 0903  Resp 18 05/03/22 0903  SpO2 100 % 05/03/22 0903    Last Pain:  Vitals:   05/03/22 0903  TempSrc: Temporal  PainSc: Asleep         Complications: No notable events documented.

## 2022-05-03 NOTE — Anesthesia Postprocedure Evaluation (Signed)
Anesthesia Post Note  Patient: Haley [name]  Procedure(s) Performed: COLONOSCOPY WITH PROPOFOL  Patient location during evaluation: PACU Anesthesia Type: General Level of consciousness: awake and alert, oriented and patient cooperative Pain management: pain level controlled Vital Signs Assessment: post-procedure vital signs reviewed and stable Respiratory status: spontaneous breathing, nonlabored ventilation and respiratory function stable Cardiovascular status: blood pressure returned to baseline and stable Postop Assessment: adequate PO intake Anesthetic complications: no   No notable events documented.   Last Vitals:  Vitals:   05/03/22 0913 05/03/22 0923  BP: 90/63 115/84  Pulse: 77 76  Resp: 16 14  Temp:    SpO2: 100% 100%    Last Pain:  Vitals:   05/03/22 0913  TempSrc:   PainSc: 0-No pain                 Reed Breech

## 2022-05-03 NOTE — H&P (Signed)
Outpatient short stay form Pre-procedure 05/03/2022  Regis Bill, MD  Primary Physician: System, Provider Not In  Reason for visit:  Surveillance colonoscopy  History of present illness:    72 y/o lady with history of DM II, hypertension, and HLD here for surveillance colonoscopy due to history of polyps. Daughter had rectal cancer at age of 13 and passed at 48. Genetic testing on patient was negative. History of tubal ligation. No blood thinners.    Current Facility-Administered Medications:    0.9 %  sodium chloride infusion, , Intravenous, Continuous, Betzayda Braxton, Rossie Muskrat, MD, Last Rate: 20 mL/hr at 05/03/22 0823, New Bag at 05/03/22 0823  Medications Prior to Admission  Medication Sig Dispense Refill Last Dose   Insulin Glargine (BASAGLAR KWIKPEN) 100 UNIT/ML SOPN Inject 30 Units into the skin at bedtime.   05/02/2022   Multiple Vitamin (MULTIVITAMIN) tablet Take 1 tablet by mouth daily.   Past Week   Semaglutide,0.25 or 0.5MG /DOS, 2 MG/1.5ML SOPN Inject into the skin once a week.   Past Week   aspirin EC 81 MG tablet Take 81 mg by mouth daily. 1/2 tab by mouth daily   05/01/2022   brimonidine (ALPHAGAN) 0.15 % ophthalmic solution Place 1 drop into both eyes every 12 (twelve) hours.      cyanocobalamin 1000 MCG tablet Take 1,000 mcg by mouth daily.      glipiZIDE (GLUCOTROL XL) 10 MG 24 hr tablet Take 10 mg by mouth 2 (two) times daily.   05/01/2022   latanoprost (XALATAN) 0.005 % ophthalmic solution Place 1 drop into both eyes at bedtime.      losartan-hydrochlorothiazide (HYZAAR) 100-25 MG tablet Take 1 tablet by mouth daily.   05/01/2022   omeprazole (PRILOSEC) 20 MG capsule Take 20 mg by mouth daily.   05/01/2022   pravastatin (PRAVACHOL) 80 MG tablet Take 80 mg by mouth daily.   05/01/2022   sitaGLIPtin-metformin (JANUMET) 50-1000 MG tablet Take 1 tablet by mouth 2 (two) times daily with a meal.   05/01/2022     No Known Allergies   Past Medical History:  Diagnosis Date    Arthritis    Diabetes mellitus without complication    Encounter for colonoscopy due to history of adenomatous colonic polyps    Exotropia of right eye    Fibroadenosis of breast, left    GERD (gastroesophageal reflux disease)    Glaucoma    Hypertension    Osgood-Schlatter's disease     Review of systems:  Otherwise negative.    Physical Exam  Gen: Alert, oriented. Appears stated age.  HEENT: PERRLA. Lungs: No respiratory distress. CV: RRR Abd: soft, benign, no masses Ext: No edema    Planned procedures: Proceed with colonoscopy. The patient understands the nature of the planned procedure, indications, risks, alternatives and potential complications including but not limited to bleeding, infection, perforation, damage to internal organs and possible oversedation/side effects from anesthesia. The patient agrees and gives consent to proceed.  Please refer to procedure notes for findings, recommendations and patient disposition/instructions.     Regis Bill, MD [facility] Gastroenterology

## 2022-05-03 NOTE — Op Note (Signed)
[facility] Gastroenterology Patient Name: Haley [name] Procedure Date: 05/03/2022 8:21 AM MRN: [id number] Account #: [id number] Date of Birth: Aug 25, 1950 Admit Type: Outpatient Age: 72 Room: [facility] ENDO ROOM 3 Gender: Female Note Status: Finalized Instrument Name: Peds Colonoscope [id number] Procedure:             Colonoscopy Indications:           Surveillance: Personal history of adenomatous polyps                         on last colonoscopy 5 years ago, Family history of                         rectal cancer in a first-degree relative before age 20                         years Providers:             Eather Colas MD, MD Medicines:             Monitored Anesthesia Care Complications:         No immediate complications. Estimated blood loss:                         Minimal. Procedure:             Pre-Anesthesia Assessment:                        - Prior to the procedure, a History and Physical was                         performed, and patient medications and allergies were                         reviewed. The patient is competent. The risks and                         benefits of the procedure and the sedation options and                         risks were discussed with the patient. All questions                         were answered and informed consent was obtained.                         Patient identification and proposed procedure were                         verified by the physician, the nurse, the                         anesthesiologist, the anesthetist and the technician                         in the endoscopy suite. Mental Status Examination:                         alert and oriented. Airway Examination: normal  oropharyngeal airway and neck mobility. Respiratory                         Examination: clear to auscultation. CV Examination:                         normal. Prophylactic Antibiotics: The patient does not                          require prophylactic antibiotics. Prior                         Anticoagulants: The patient has taken no anticoagulant                         or antiplatelet agents. ASA Grade Assessment: III - A                         patient with severe systemic disease. After reviewing                         the risks and benefits, the patient was deemed in                         satisfactory condition to undergo the procedure. The                         anesthesia plan was to use monitored anesthesia care                         (MAC). Immediately prior to administration of                         medications, the patient was re-assessed for adequacy                         to receive sedatives. The heart rate, respiratory                         rate, oxygen saturations, blood pressure, adequacy of                         pulmonary ventilation, and response to care were                         monitored throughout the procedure. The physical                         status of the patient was re-assessed after the                         procedure.                        After obtaining informed consent, the colonoscope was                         passed under direct vision. Throughout the procedure,  the patient's blood pressure, pulse, and oxygen                         saturations were monitored continuously. The                         Colonoscope was introduced through the anus and                         advanced to the the cecum, identified by appendiceal                         orifice and ileocecal valve. The colonoscopy was                         performed without difficulty. The patient tolerated                         the procedure well. The quality of the bowel                         preparation was fair. The ileocecal valve, appendiceal                         orifice, and rectum were photographed. Findings:      The perianal and digital  rectal examinations were normal.      A 2 mm polyp was found in the ascending colon. The polyp was sessile.       The polyp was removed with a jumbo cold forceps. Resection and retrieval       were complete. Estimated blood loss was minimal.      Five sessile polyps were found in the transverse colon. The polyps were       3 to 5 mm in size. These polyps were removed with a cold snare.       Resection and retrieval were complete. Estimated blood loss was minimal.      Internal hemorrhoids were found during retroflexion. The hemorrhoids       were Grade I (internal hemorrhoids that do not prolapse).      The exam was otherwise without abnormality on direct and retroflexion       views. Impression:            - Preparation of the colon was fair.                        - One 2 mm polyp in the ascending colon, removed with                         a jumbo cold forceps. Resected and retrieved.                        - Five 3 to 5 mm polyps in the transverse colon,                         removed with a cold snare. Resected and retrieved.                        - Internal hemorrhoids.                        -  The examination was otherwise normal on direct and                         retroflexion views. Recommendation:        - Discharge patient to home.                        - Resume previous diet.                        - Continue present medications.                        - Await pathology results.                        - Repeat colonoscopy in 1 year because the bowel                         preparation was suboptimal.                        - Return to referring physician as previously                         scheduled. Procedure Code(s):     --- Professional ---                        [phone], Colonoscopy, flexible; with removal of                         tumor(s), polyp(s), or other lesion(s) by snare                         technique                        45380, 59, Colonoscopy,  flexible; with biopsy, single                         or multiple Diagnosis Code(s):     --- Professional ---                        Z86.010, Personal history of colonic polyps                        K64.0, First degree hemorrhoids                        D12.2, Benign neoplasm of ascending colon                        D12.3, Benign neoplasm of transverse colon (hepatic                         flexure or splenic flexure)                        Z80.0, Family history of malignant neoplasm of                         digestive  organs CPT copyright 2022 American Medical Association. All rights reserved. The codes documented in this report are preliminary and upon coder review may  be revised to meet current compliance requirements. Eather Colas MD, MD 05/03/2022 9:05:29 AM Number of Addenda: 0 Note Initiated On: 05/03/2022 8:21 AM Scope Withdrawal Time: 0 hours 15 minutes 15 seconds  Total Procedure Duration: 0 hours 23 minutes 28 seconds  Estimated Blood Loss:  Estimated blood loss was minimal.      [facility]

## 2022-05-03 NOTE — Interval H&P Note (Signed)
History and Physical Interval Note:  05/03/2022 8:31 AM  Haley [name]  has presented today for surgery, with the diagnosis of h/o colon polyps.  The various methods of treatment have been discussed with the patient and family. After consideration of risks, benefits and other options for treatment, the patient has consented to  Procedure(s): COLONOSCOPY WITH PROPOFOL (N/A) as a surgical intervention.  The patient's history has been reviewed, patient examined, no change in status, stable for surgery.  I have reviewed the patient's chart and labs.  Questions were answered to the patient's satisfaction.     Regis Bill  Ok to proceed with colonoscopy

## 2022-05-03 NOTE — Anesthesia Preprocedure Evaluation (Addendum)
Anesthesia Evaluation  Patient identified by MRN, date of birth, ID band Patient awake    Reviewed: Allergy & Precautions, NPO status , Patient's Chart, lab work & pertinent test results  History of Anesthesia Complications Negative for: history of anesthetic complications  Airway Mallampati: I   Neck ROM: Full    Dental  (+) Missing   Pulmonary neg pulmonary ROS   Pulmonary exam normal breath sounds clear to auscultation       Cardiovascular hypertension, Normal cardiovascular exam Rhythm:Regular Rate:Normal     Neuro/Psych negative neurological ROS     GI/Hepatic ,GERD  ,,  Endo/Other  diabetes, Type 2    Renal/GU Renal disease (stage IV CKD)     Musculoskeletal  (+) Arthritis ,    Abdominal   Peds  Hematology negative hematology ROS (+)   Anesthesia Other Findings Last dose of Ozempic 2 weeks ago.  Reproductive/Obstetrics                             Anesthesia Physical Anesthesia Plan  ASA: 2  Anesthesia Plan: General   Post-op Pain Management:    Induction: Intravenous  PONV Risk Score and Plan: 3 and Propofol infusion, TIVA and Treatment may vary due to age or medical condition  Airway Management Planned: Natural Airway  Additional Equipment:   Intra-op Plan:   Post-operative Plan:   Informed Consent: I have reviewed the patients History and Physical, chart, labs and discussed the procedure including the risks, benefits and alternatives for the proposed anesthesia with the patient or authorized representative who has indicated his/her understanding and acceptance.       Plan Discussed with: CRNA  Anesthesia Plan Comments: (LMA/GETA backup discussed.  Patient consented for risks of anesthesia including but not limited to:  - adverse reactions to medications - damage to eyes, teeth, lips or other oral mucosa - nerve damage due to positioning  - sore throat or  hoarseness - damage to heart, brain, nerves, lungs, other parts of body or loss of life  Informed patient about role of CRNA in peri- and intra-operative care.  Patient voiced understanding.)       Anesthesia Quick Evaluation

## 2022-05-06 ENCOUNTER — Encounter: Payer: Self-pay | Admitting: Gastroenterology

## 2022-05-06 LAB — SURGICAL PATHOLOGY

## 2023-03-17 ENCOUNTER — Encounter: Payer: Self-pay | Admitting: Family Medicine

## 2023-03-17 DIAGNOSIS — Z780 Asymptomatic menopausal state: Secondary | ICD-10-CM

## 2023-09-19 ENCOUNTER — Ambulatory Visit: Admission: RE | Admit: 2023-09-19 | Source: Home / Self Care | Admitting: Gastroenterology

## 2023-09-19 ENCOUNTER — Encounter: Admission: RE | Payer: Self-pay | Source: Home / Self Care

## 2023-09-19 SURGERY — COLONOSCOPY
Anesthesia: General
